# Patient Record
Sex: Male | Born: 1980 | Race: White | Hispanic: No | Marital: Single | Smoking: Current every day smoker
Health system: Southern US, Community
[De-identification: ages and names within clinical notes are randomized; demographics above are authoritative.]

## PROBLEM LIST (undated history)

## (undated) HISTORY — PX: NASAL SINUS SURGERY: SHX719

---

## 2009-11-22 ENCOUNTER — Emergency Department (HOSPITAL_COMMUNITY): Admission: EM | Admit: 2009-11-22 | Discharge: 2009-11-22 | Payer: Self-pay | Admitting: Family Medicine

## 2011-11-06 ENCOUNTER — Emergency Department (INDEPENDENT_AMBULATORY_CARE_PROVIDER_SITE_OTHER)
Admission: EM | Admit: 2011-11-06 | Discharge: 2011-11-06 | Disposition: A | Discharge status: Home / Self Care | Payer: No Typology Code available for payment source | Source: Home / Self Care | Attending: Family Medicine | Admitting: Family Medicine

## 2011-11-06 ENCOUNTER — Emergency Department (INDEPENDENT_AMBULATORY_CARE_PROVIDER_SITE_OTHER): Payer: No Typology Code available for payment source

## 2011-11-06 ENCOUNTER — Encounter (HOSPITAL_COMMUNITY): Payer: Self-pay | Admitting: *Deleted

## 2011-11-06 DIAGNOSIS — M7989 Other specified soft tissue disorders: Secondary | ICD-10-CM

## 2011-11-06 LAB — CBC WITH DIFFERENTIAL/PLATELET
Basophils Absolute: 0 10*3/uL (ref 0.0–0.1)
Eosinophils Absolute: 0 10*3/uL (ref 0.0–0.7)
Eosinophils Relative: 0 % (ref 0–5)
HCT: 39.3 % (ref 39.0–52.0)
Lymphocytes Relative: 7 % — ABNORMAL LOW (ref 12–46)
MCH: 29.7 pg (ref 26.0–34.0)
MCHC: 34.1 g/dL (ref 30.0–36.0)
MCV: 87.1 fL (ref 78.0–100.0)
Monocytes Absolute: 1 10*3/uL (ref 0.1–1.0)
Platelets: 155 10*3/uL (ref 150–400)
RDW: 12.2 % (ref 11.5–15.5)
WBC: 12.5 10*3/uL — ABNORMAL HIGH (ref 4.0–10.5)

## 2011-11-06 MED ORDER — CEPHALEXIN 500 MG PO CAPS: 500.0000 mg | ORAL_CAPSULE | Freq: Two times a day (BID) | ORAL | Status: AC

## 2011-11-06 MED ORDER — TRAMADOL HCL 50 MG PO TABS: 50.0000 mg | ORAL_TABLET | Freq: Four times a day (QID) | ORAL | Status: AC | PRN

## 2011-11-06 MED ORDER — NAPROXEN 500 MG PO TABS: 500.0000 mg | ORAL_TABLET | Freq: Two times a day (BID) | ORAL | Status: AC

## 2011-11-06 NOTE — ED Provider Notes (Signed)
History     CSN: [id number]  Arrival date & time 11/06/11  1645   First MD Initiated Contact with Patient 11/06/11 1708      Chief Complaint  Patient presents with  . Foot Swelling    (Consider location/radiation/quality/duration/timing/severity/associated sxs/prior treatment) HPI Comments: 31 year old male smoker otherwise no significant past medical history. Here complaining of right foot swelling and tenderness first noticed in a.m. today. Reports swelling is getting worse to the point of feeling his shoe to tight and had to remove it later today due to discomfort. Patient reports that he feels some tightness in the dorsum of his right food when he starts walking but this discomfort gets progressively better. Has not taken any medication for pain. Denies any known injury but states that he may have had a furniture heating the dorsum of his foot on Saturday as he moved furniture from one place to another at work. He is unsure about insect bite. He wears sneakers shoes at work. No skin breaks or bruising. Denies discomfort or swelling in right thigh or right lower leg. No personal or family history of blood clots, denies prolonged sitting or standing before his symptoms started. No recent car or airplane trips. Has no taken medications for his symptoms.   History reviewed. No pertinent past medical history.  Past Surgical History  Procedure Date  . Nasal sinus surgery     No family history on file.  History  Substance Use Topics  . Smoking status: Current Everyday Smoker -- 1.0 packs/day  . Smokeless tobacco: Not on file  . Alcohol Use: 18.0 oz/week    30 Cans of beer per week      Review of Systems  Allergies  Review of patient's allergies indicates no known allergies.  Home Medications   Current Outpatient Rx  Name Route Sig Dispense Refill  . CEPHALEXIN 500 MG PO CAPS Oral Take 1 capsule (500 mg total) by mouth 2 (two) times daily. 14 capsule 0  . NAPROXEN 500 MG PO  TABS Oral Take 1 tablet (500 mg total) by mouth 2 (two) times daily with a meal. 20 tablet 0  . TRAMADOL HCL 50 MG PO TABS Oral Take 1 tablet (50 mg total) by mouth every 6 (six) hours as needed for pain. 20 tablet 0    BP 135/80  Pulse 90  Temp 99.5 F (37.5 C) (Oral)  Resp 16  SpO2 100%  Physical Exam  Nursing note and vitals reviewed. Constitutional: He is oriented to person, place, and time. He appears well-developed and well-nourished. No distress.  HENT:  Head: Normocephalic and atraumatic.  Neck: Neck supple.  Cardiovascular: Normal rate, regular rhythm, normal heart sounds and intact distal pulses.  Exam reveals no gallop and no friction rub.   No murmur heard. Pulmonary/Chest: Effort normal and breath sounds normal. No respiratory distress. He has no wheezes. He has no rales. He exhibits no tenderness.  Musculoskeletal:       Right foot: dorsal moderate swelling mostly proximal  in tarsal/metatarsal area with associated erythema and tenderness to palpation. No focal tenderness or fluctuations, skin pustules, abrasions or lacerations. No involvement of MPJ or toes. No ankle bimalleolar swelling or pain with palpation. No bruising, hematomas, echymosis or distal cyanosis. Has intact dorsal pedal and tibial posterior pulses. Intact superficial sensation. No calf or lower leg swelling, erythema or tenderness.  Patient is weight bearing and ambulates with no limping. ( see picture below)  Lymphadenopathy:    He has no  cervical adenopathy.       Right: No inguinal adenopathy present.  Neurological: He is alert and oriented to person, place, and time.  Skin: He is not diaphoretic.       As per MS exam above.         ED Course  Procedures (including critical care time)  Labs Reviewed  CBC WITH DIFFERENTIAL - Abnormal; Notable for the following:    WBC 12.5 (*)     Neutrophils Relative 84 (*)     Neutro Abs 10.6 (*)     Lymphocytes Relative 7 (*)     All other  components within normal limits  URIC ACID   Dg Foot Complete Right  11/06/2011  *RADIOLOGY REPORT*  Clinical Data: Redness and swelling over the metatarsals.  No known injury.  RIGHT FOOT COMPLETE - 3+ VIEW  Comparison: None.  Findings: Soft tissue swelling is seen over the dorsum of the foot. No underlying fracture or dislocation.  No radiopaque foreign body or bony destructive change.  IMPRESSION: Soft tissue swelling.  Otherwise negative.   Original Report Authenticated By: Bernadene Bell. D'ALESSIO, M.D.      1. Foot swelling       MDM  Dorsal foot swelling unknown origin like trauma vs insect bite. Although no skin pustules, brakes or bruising. Erythema concerning for cellulitis or developing underlying skin infections no focalization or pain or fluctuation suggestive of an abscess. Decided to treat with cephalexin, naprosyn, tramadol and supportive care. CBC and uric acid were pending at time of discharge. Asked to return in 48 h or earlier if worsening or new symptoms despite following treatment.         Sharin Grave, MD 11/08/11 1026

## 2011-11-06 NOTE — ED Notes (Signed)
States woke up this morning with swelling and pain to dorsum of right foot.  Does not recall any specific injuries.  Redness and significant swelling noted to area.  States his foot feels tight with first couple of steps when ambulating, but then it starts to improve the more he walks.  Unaware of any fevers.  Has not taken any meds.

## 2011-11-06 NOTE — Discharge Instructions (Signed)
My impression is that the swelling in your foot is likely related to an unknown trauma. There no signs of bone involvement in your x-rays.  There is a risk of infection this is why I decided to prescribe an antibiotic. Keep right lower extremity elevated and use ice pad in the affected area as much as possible. Take the prescribed medications as instructed. Be aware that tramadol can make you drowsy and he should not drive after taking this medication. We will contact you if any of your pending tests is abnormal. Preterm for followup in 48 hours if persistent symptoms or return earlier if worsening symptoms despite following treatment.

## 2011-11-06 NOTE — ED Notes (Signed)
Patient refusing post-op shoe; states, "I know I won't wear it".

## 2013-03-20 ENCOUNTER — Emergency Department (HOSPITAL_COMMUNITY)
Admission: EM | Admit: 2013-03-20 | Discharge: 2013-03-20 | Disposition: A | Discharge status: Home / Self Care | Payer: No Typology Code available for payment source | Source: Home / Self Care

## 2013-03-20 ENCOUNTER — Encounter (HOSPITAL_COMMUNITY): Payer: Self-pay | Admitting: Emergency Medicine

## 2013-03-20 DIAGNOSIS — S61409A Unspecified open wound of unspecified hand, initial encounter: Secondary | ICD-10-CM

## 2013-03-20 DIAGNOSIS — IMO0001 Reserved for inherently not codable concepts without codable children: Secondary | ICD-10-CM

## 2013-03-20 DIAGNOSIS — T148XXA Other injury of unspecified body region, initial encounter: Secondary | ICD-10-CM

## 2013-03-20 DIAGNOSIS — S61451A Open bite of right hand, initial encounter: Secondary | ICD-10-CM

## 2013-03-20 DIAGNOSIS — W5501XA Bitten by cat, initial encounter: Principal | ICD-10-CM

## 2013-03-20 DIAGNOSIS — L089 Local infection of the skin and subcutaneous tissue, unspecified: Secondary | ICD-10-CM

## 2013-03-20 DIAGNOSIS — T798XXA Other early complications of trauma, initial encounter: Secondary | ICD-10-CM

## 2013-03-20 MED ORDER — CEFTRIAXONE SODIUM 1 G IJ SOLR
1.0000 g | Freq: Once | INTRAMUSCULAR | Status: AC
  Administered 2013-03-20: 1 g via INTRAMUSCULAR

## 2013-03-20 MED ORDER — LIDOCAINE HCL (PF) 1 % IJ SOLN
INTRAMUSCULAR | Status: AC
  Filled 2013-03-20: qty 5

## 2013-03-20 MED ORDER — AMOXICILLIN-POT CLAVULANATE 875-125 MG PO TABS: 1.0000 | ORAL_TABLET | Freq: Two times a day (BID) | ORAL | Status: DC

## 2013-03-20 MED ORDER — HYDROCODONE-ACETAMINOPHEN 5-325 MG PO TABS: 1.0000 | ORAL_TABLET | Freq: Four times a day (QID) | ORAL | Status: DC | PRN

## 2013-03-20 MED ORDER — CEFTRIAXONE SODIUM 1 G IJ SOLR
INTRAMUSCULAR | Status: AC
  Filled 2013-03-20: qty 10

## 2013-03-20 NOTE — ED Notes (Signed)
Pt reports that he was bitten by his cat yest on right hand Sxs include: swelling, redness, tenderness... Pain increases w/activity/movement Cat is 1 month behind on his rabies vaccination but has had rabies vaccination in the past He is alert w/no signs of acute distress.

## 2013-03-20 NOTE — ED Provider Notes (Signed)
CSN: [id number]     Arrival date & time 03/20/13  1804 History   None    Chief Complaint  Patient presents with  . Animal Bite   (Consider location/radiation/quality/duration/timing/severity/associated sxs/prior Treatment) HPI Comments: 33 yo male with recent cat scratch on dominant right hand has started to swell and has become red and painful. He notes it is difficult to make fist due to swelling and pain. He denies numbness and tingling. He has not tried any OTC or home remedies. He notes cat has not been acting inappropriately and has started rabies vaccinations.   No past medical history on file. Past Surgical History  Procedure Laterality Date  . Nasal sinus surgery     No family history on file. History  Substance Use Topics  . Smoking status: Current Every Day Smoker -- 1.00 packs/day  . Smokeless tobacco: Not on file  . Alcohol Use: 18.0 oz/week    30 Cans of beer per week    Review of Systems  Musculoskeletal: Positive for joint swelling and myalgias.  Skin: Positive for color change and wound.  All other systems reviewed and are negative.    Allergies  Review of patient's allergies indicates no known allergies.  Home Medications  No current outpatient prescriptions on file. There were no vitals taken for this visit. Physical Exam  Constitutional: He is oriented to person, place, and time. He appears well-developed and well-nourished.  Cardiovascular: Normal rate, regular rhythm and normal heart sounds.   Musculoskeletal: He exhibits edema and tenderness.       Arms: Decreased ROM right hand due to swelling and erythema in area marked   Neurological: He is alert and oriented to person, place, and time. No cranial nerve deficit. Coordination normal.  Skin: Skin is warm and dry. There is erythema.  Psychiatric: Judgment normal.    ED Course  Procedures (including critical care time) Labs Review Labs Reviewed - No data to display Imaging Review No results  found.    MDM  Cat scratch Right hand with Cellulitis- If SX increase ER. Advised if no change needs f/u Dr Merrilee SeashoreKuzma's office. R.I.C.E Rocephin 1 gm in office, Aug 875 bid AD with food. Spoke with DR. Hatcher in I&D about rabies and he noted since cat had already started vaccinations and was not exhibiting symptoms no need for Rabies series to be initiated.  Berenice PrimasMelissa R Marayah Higdon, PA-C 03/20/13 2214

## 2013-03-20 NOTE — Discharge Instructions (Signed)
If symptoms increase this could happen if they occur ER ASAP Acute Compartment Syndrome Compartment syndrome is a painful condition that occurs when swelling and pressure build up in a body space (compartment) of the arms or legs. Groups of muscles, nerves, and blood vessels in the arms and legs are separated into various compartments. Each compartment is surrounded by tough layers of tissue called fascia. In compartment syndrome, pressure builds up within the layers of fascia and begins to push on the structures within that compartment.  In acute compartment syndrome, the pressure builds up suddenly, often as the result of an injury. This is a surgical emergency. When a muscle in the compartment moves, you may feel severe pain. If pressure continues to increase, it can block the flow of blood in the smallest blood vessels (capillaries). Then, the nerves and muscles in the compartment cannot get enough oxygen and nutrients (substances needed for survival). They will start to die within 4 8 hours. That is why the pressure needs to be relieved immediately. Identifying the condition early and treating it quickly can prevent most problems. CAUSES  Various things can lead to compartment syndrome. Possible causes include:   Injury. Some injuries can cause swelling or bleeding in a compartment. This can lead to compartment syndrome. Injuries that may cause this problem include:  Broken bones, especially the long bones of the arms and legs.  Crushing injuries.  Penetrating injuries, such as a knife wound that punctures the skin and tissue underneath.  Badly bruised muscles.  Poisonous bites, such as a snake bite.  Severe burns.  Blocked blood flow. This could result from:  A cast or bandage that is too tight.  A surgical procedure. Blood flow sometimes has to be stopped for a while during a surgery, usually with a tourniquet.  Lying for too long in a position that restricts blood flow. This can  happen in people who have nerve damage or if a person is unconscious for a long time.  Drugs used to build up muscles (anabolic steroids).  Drugs that keep the blood from forming clots (blood thinners). SIGNS AND SYMPTOMS  The most common symptom of compartment syndrome is pain. The pain may:   Get worse when moving or stretching the affected body part.  Be more severe than it should be for an injury.  Come along with a feeling of tingling or burning.  Become worse when the area is pushed or squeezed.  Be unaffected by pain medicine. Other symptoms include:   A feeling of tightness or fullness in the affected area.   A loss of feeling.  Weakness in the area.  Loss of movement.  Skin becoming pale, tight, and shiny over the painful area.  DIAGNOSIS  Your health care provider may suspect the problem based on how you describe the pain. The diagnosis is made by using a special device that measures the pressure in the affected area. Blood tests, X-rays, or an ultrasound exam may be done to help rule out other problems.  TREATMENT  Compartment syndrome is a surgical emergency. It should be treated very quickly.   First-aid treatment is given first. This may include:  Promptly treating an injury.  Loosening or removing any cast, bandage, or external wrap that may be causing pain.  Raising the painful arm or leg to the same level as the heart.  Giving oxygen.  Giving fluid through an IV access tube that is put into a vein in the hand or arm.  Surgery (fasciotomy) is needed to relieve the pressure and help prevent permanent damage. In this surgery, cuts (incisions) are made through the fascia to relieve the pressure in the compartment. Document Released: 02/15/2009 Document Revised: 10/30/2012 Document Reviewed: 10/01/2012 [facility] Patient Information 2014 [place] [employer], [place].  Animal Bite Animal bite wounds can get infected. It is important to get proper medical treatment. Ask  your doctor if you need a rabies shot. HOME CARE   Follow your doctor's instructions for taking care of your wound.  Only take medicine as told by your doctor.  Take your medicine (antibiotics) as told. Finish them even if you start to feel better.  Keep all doctor visits as told. You may need a tetanus shot if:   You cannot remember when you had your last tetanus shot.  You have never had a tetanus shot.  The injury broke your skin. If you need a tetanus shot and you choose not to have one, you may get tetanus. Sickness from tetanus can be serious. GET HELP RIGHT AWAY IF:   Your wound is warm, red, sore, or puffy (swollen).  You notice yellowish-white fluid (pus) or a bad smell coming from the wound.  You see a red line on the skin coming from the wound.  You have a fever, chills, or you feel sick.  You feel sick to your stomach (nauseous), or you throw up (vomit).  Your pain does not go away, or it gets worse.  You have trouble moving the injured part.  You have questions or concerns. MAKE SURE YOU:   Understand these instructions.  Will watch your condition.  Will get help right away if you are not doing well or get worse. Document Released: 02/27/2005 Document Revised: 05/22/2011 Document Reviewed: 10/19/2010 [facility] Patient Information 2014 [place], [place].

## 2013-03-21 ENCOUNTER — Inpatient Hospital Stay (HOSPITAL_COMMUNITY)
Admission: EM | Admit: 2013-03-21 | Discharge: 2013-03-22 | DRG: 914 | Disposition: A | Discharge status: Home / Self Care | Payer: No Typology Code available for payment source | Attending: Internal Medicine | Admitting: Internal Medicine

## 2013-03-21 ENCOUNTER — Encounter (HOSPITAL_COMMUNITY): Payer: Self-pay | Admitting: Emergency Medicine

## 2013-03-21 ENCOUNTER — Emergency Department (INDEPENDENT_AMBULATORY_CARE_PROVIDER_SITE_OTHER)
Admission: EM | Admit: 2013-03-21 | Discharge: 2013-03-21 | Disposition: A | Discharge status: Home / Self Care | Payer: No Typology Code available for payment source | Source: Home / Self Care | Attending: Family Medicine | Admitting: Family Medicine

## 2013-03-21 DIAGNOSIS — S61209A Unspecified open wound of unspecified finger without damage to nail, initial encounter: Principal | ICD-10-CM | POA: Diagnosis present

## 2013-03-21 DIAGNOSIS — L02519 Cutaneous abscess of unspecified hand: Secondary | ICD-10-CM | POA: Diagnosis present

## 2013-03-21 DIAGNOSIS — S61451A Open bite of right hand, initial encounter: Secondary | ICD-10-CM

## 2013-03-21 DIAGNOSIS — W5501XD Bitten by cat, subsequent encounter: Secondary | ICD-10-CM

## 2013-03-21 DIAGNOSIS — S61451D Open bite of right hand, subsequent encounter: Secondary | ICD-10-CM

## 2013-03-21 DIAGNOSIS — M65849 Other synovitis and tenosynovitis, unspecified hand: Secondary | ICD-10-CM

## 2013-03-21 DIAGNOSIS — L03119 Cellulitis of unspecified part of limb: Secondary | ICD-10-CM

## 2013-03-21 DIAGNOSIS — IMO0001 Reserved for inherently not codable concepts without codable children: Secondary | ICD-10-CM

## 2013-03-21 DIAGNOSIS — L089 Local infection of the skin and subcutaneous tissue, unspecified: Secondary | ICD-10-CM

## 2013-03-21 DIAGNOSIS — W5501XA Bitten by cat, initial encounter: Secondary | ICD-10-CM

## 2013-03-21 DIAGNOSIS — S61259A Open bite of unspecified finger without damage to nail, initial encounter: Secondary | ICD-10-CM

## 2013-03-21 DIAGNOSIS — Y92009 Unspecified place in unspecified non-institutional (private) residence as the place of occurrence of the external cause: Secondary | ICD-10-CM

## 2013-03-21 DIAGNOSIS — S61409A Unspecified open wound of unspecified hand, initial encounter: Secondary | ICD-10-CM | POA: Diagnosis present

## 2013-03-21 DIAGNOSIS — F17200 Nicotine dependence, unspecified, uncomplicated: Secondary | ICD-10-CM | POA: Diagnosis present

## 2013-03-21 DIAGNOSIS — M65839 Other synovitis and tenosynovitis, unspecified forearm: Secondary | ICD-10-CM

## 2013-03-21 DIAGNOSIS — M659 Synovitis and tenosynovitis, unspecified: Secondary | ICD-10-CM

## 2013-03-21 LAB — CBC WITH DIFFERENTIAL/PLATELET
Basophils Absolute: 0 10*3/uL (ref 0.0–0.1)
Basophils Relative: 0 % (ref 0–1)
EOS PCT: 1 % (ref 0–5)
Eosinophils Absolute: 0.1 10*3/uL (ref 0.0–0.7)
HEMATOCRIT: 41.7 % (ref 39.0–52.0)
Hemoglobin: 14.4 g/dL (ref 13.0–17.0)
LYMPHS ABS: 1.8 10*3/uL (ref 0.7–4.0)
LYMPHS PCT: 15 % (ref 12–46)
MCH: 30.8 pg (ref 26.0–34.0)
MCHC: 34.5 g/dL (ref 30.0–36.0)
MCV: 89.1 fL (ref 78.0–100.0)
Monocytes Absolute: 1.2 10*3/uL — ABNORMAL HIGH (ref 0.1–1.0)
Monocytes Relative: 10 % (ref 3–12)
NEUTROS ABS: 8.6 10*3/uL — AB (ref 1.7–7.7)
Neutrophils Relative %: 74 % (ref 43–77)
PLATELETS: 143 10*3/uL — AB (ref 150–400)
RBC: 4.68 MIL/uL (ref 4.22–5.81)
RDW: 12.4 % (ref 11.5–15.5)
WBC: 11.6 10*3/uL — AB (ref 4.0–10.5)

## 2013-03-21 LAB — BASIC METABOLIC PANEL
BUN: 8 mg/dL (ref 6–23)
CALCIUM: 9.4 mg/dL (ref 8.4–10.5)
CHLORIDE: 99 meq/L (ref 96–112)
CO2: 26 meq/L (ref 19–32)
Creatinine, Ser: 0.7 mg/dL (ref 0.50–1.35)
GFR calc Af Amer: >90 mL/min (ref >90)
GFR calc non Af Amer: >90 mL/min (ref >90)
GLUCOSE: 99 mg/dL (ref 70–99)
Potassium: 4 mEq/L (ref 3.7–5.3)
SODIUM: 138 meq/L (ref 137–147)

## 2013-03-21 MED ORDER — OXYCODONE-ACETAMINOPHEN 5-325 MG PO TABS
2.0000 | ORAL_TABLET | Freq: Once | ORAL | Status: AC
  Administered 2013-03-21: 2 via ORAL
  Filled 2013-03-21: qty 2

## 2013-03-21 MED ORDER — OXYCODONE-ACETAMINOPHEN 5-325 MG PO TABS: 1.0000 | ORAL_TABLET | Freq: Once | ORAL | Status: DC

## 2013-03-21 NOTE — ED Notes (Signed)
Chart consult

## 2013-03-21 NOTE — ED Notes (Signed)
Pt states that Vicodin prescribed by [facility] did not help with pain but the percocet he received here helped very much. Also states since he has been here redness has decreased.

## 2013-03-21 NOTE — ED Notes (Signed)
Presents with cat bite to right hand occurred 2 days ago [facility] and given antibiotic, Augmentin and hydrocodone for pain. Today redness warmth and swelling has increased and spread above wrist. Pain is severe, unable to sleep, pain medication not helping. CMS intact, +2 radial pulse.

## 2013-03-21 NOTE — ED Provider Notes (Signed)
Patrick Mahoney is a 33 y.o. male who presents to Urgent Care today for right hand infection. Patient was bitten on the right hand 2 days ago. He was seen in the emergency room yesterday when his hand became painful red and swollen. He was treated empirically with gram of IM ceftriaxone, and oral Augmentin. This has not helped much. His pain and swelling have worsened. He notes severe pain in pain with hand motion. He denies any pain radiating beyond his wrist weakness or numbness. He is right-hand dominant. Of note patient drinks about a sixpack a day. He has successfully quit drinking in the recent past without any withdrawal symptoms.  History reviewed. No pertinent past medical history. History  Substance Use Topics  . Smoking status: Current Every Day Smoker -- 1.00 packs/day  . Smokeless tobacco: Not on file  . Alcohol Use: 18.0 oz/week    30 Cans of beer per week   ROS as above Medications reviewed. No current facility-administered medications for this encounter.   Current Outpatient Prescriptions  Medication Sig Dispense Refill  . amoxicillin-clavulanate (AUGMENTIN) 875-125 MG per tablet Take 1 tablet by mouth 2 (two) times daily.  20 tablet  0  . HYDROcodone-acetaminophen (NORCO) 5-325 MG per tablet Take 1 tablet by mouth every 6 (six) hours as needed for moderate pain.  15 tablet  0    Exam:  BP 150/86  Pulse 60  Temp(Src) 98 F (36.7 C) (Oral)  Resp 16  SpO2 100% Gen: Well NAD HEENT:  MMM Lungs: Normal work of breathing. CTABL Exts:warm and well perfused.  Neuro: Slight tremor bilaterally Right hand: Erythema and induration especially in the interdigital webspace between the first and second digits. Erythema extends to the 4th dorsal compartment. Additionally erythema extends just past the proximal carpal row and to the MCPs.  Pain with and flexion motion and wrist motion. Capillary refill and sensation are intact distally.  Assessment and Plan: 33 y.o. male with  worsening infected tenosynovitis secondary to cat bite despite IM ceftriaxone and Augmentin. This is concerning for tenosynovitis and not just cellulitis. Patient will likely require IV antibiotics, and consultation with hand surgery with possible drainage of abscess tonight. Plan to transfer to the emergency room via shuttle for this to be done.  Discussed warning signs or symptoms. Please see discharge instructions. Patient expresses understanding.    Rodolph BongEvan S Kein Carlberg, MD 03/21/13 [phone]

## 2013-03-21 NOTE — ED Notes (Signed)
Pt here for follow up on right hand infection.  Still having pain 9/10.  Mild swelling and redness.  Taking meds as prescribed.

## 2013-03-22 DIAGNOSIS — W5501XA Bitten by cat, initial encounter: Secondary | ICD-10-CM

## 2013-03-22 DIAGNOSIS — L089 Local infection of the skin and subcutaneous tissue, unspecified: Secondary | ICD-10-CM | POA: Diagnosis present

## 2013-03-22 DIAGNOSIS — S61259A Open bite of unspecified finger without damage to nail, initial encounter: Secondary | ICD-10-CM

## 2013-03-22 DIAGNOSIS — S61451A Open bite of right hand, initial encounter: Secondary | ICD-10-CM

## 2013-03-22 DIAGNOSIS — IMO0001 Reserved for inherently not codable concepts without codable children: Secondary | ICD-10-CM

## 2013-03-22 DIAGNOSIS — S61409A Unspecified open wound of unspecified hand, initial encounter: Secondary | ICD-10-CM

## 2013-03-22 DIAGNOSIS — Z5189 Encounter for other specified aftercare: Secondary | ICD-10-CM

## 2013-03-22 MED ORDER — SODIUM CHLORIDE 0.9 % IV SOLN: INTRAVENOUS | Status: DC

## 2013-03-22 MED ORDER — HYDROCODONE-ACETAMINOPHEN 5-325 MG PO TABS
1.0000 | ORAL_TABLET | Freq: Four times a day (QID) | ORAL | Status: DC | PRN
Start: 2013-03-22 — End: 2013-03-22

## 2013-03-22 MED ORDER — DOXYCYCLINE HYCLATE 100 MG PO TABS: 100.0000 mg | ORAL_TABLET | Freq: Two times a day (BID) | ORAL | Status: DC

## 2013-03-22 MED ORDER — SODIUM CHLORIDE 0.9 % IV SOLN
3.0000 g | Freq: Four times a day (QID) | INTRAVENOUS | Status: DC
  Administered 2013-03-22 (×3): 3 g via INTRAVENOUS
  Filled 2013-03-22 (×6): qty 3

## 2013-03-22 MED ORDER — SODIUM CHLORIDE 0.9 % IV SOLN
Freq: Once | INTRAVENOUS | Status: AC
  Administered 2013-03-22: 02:00:00 via INTRAVENOUS

## 2013-03-22 NOTE — Progress Notes (Signed)
Patient provided with discharge instructions and follow up information. Educated on the importance of finishing full dose of antibiotics as prescribed. He is going home in private vehicle with friends at this time.

## 2013-03-22 NOTE — H&P (Signed)
[facility] History and Physical  Patrick Mahoney ZOX:[id number] DOB: [birth year] DOA: 03/21/2013  Referring physician: EDP PCP: No PCP Per Patient   Chief Complaint: Cat bite   HPI: Patrick Mahoney is a 33 y.o. male who was bit on R hand 2 days ago by his cat.  Patient owns the cat, cat has started rabies vaccinations, cat not acting unusual or differently.  1 day after the cat bite his hand became inflamed and swollen.  He was seen in the ED and started on ampicillin.  Unfortunately the swelling and erythema spread despite PO treatment and so he re-presented to the ED today.  Review of Systems: Systems reviewed.  As above, otherwise negative  History reviewed. No pertinent past medical history. Past Surgical History  Procedure Laterality Date  . Nasal sinus surgery     Social History:  reports that he has been smoking.  He does not have any smokeless tobacco history on file. He reports that he drinks about 18.0 ounces of alcohol per week. He reports that he does not use illicit drugs.  No Known Allergies  History reviewed. No pertinent family history.   Prior to Admission medications   Medication Sig Start Date End Date Taking? Authorizing Provider  amoxicillin-clavulanate (AUGMENTIN) 875-125 MG per tablet Take 1 tablet by mouth 2 (two) times daily. 03/20/13  Yes Melissa R Smith, PA-C  HYDROcodone-acetaminophen (NORCO) 5-325 MG per tablet Take 1 tablet by mouth every 6 (six) hours as needed for moderate pain. 03/20/13  Yes Berenice Primas, PA-C   Physical Exam: Filed Vitals:   03/22/13 0218  BP: 128/87  Pulse: 54  Temp: 98.7 F (37.1 C)  Resp: 14    BP 128/87  Pulse 54  Temp(Src) 98.7 F (37.1 C) (Oral)  Resp 14  Wt 59.988 kg (132 lb 4 oz)  SpO2 100%  General Appearance:    Alert, oriented, no distress, appears stated age  Head:    Normocephalic, atraumatic  Eyes:    PERRL, EOMI, sclera non-icteric        Nose:   Nares without drainage or epistaxis.  Mucosa, turbinates normal  Throat:   Moist mucous membranes. Oropharynx without erythema or exudate.  Neck:   Supple. No carotid bruits.  No thyromegaly.  No lymphadenopathy.   Back:     No CVA tenderness, no spinal tenderness  Lungs:     Clear to auscultation bilaterally, without wheezes, rhonchi or rales  Chest wall:    No tenderness to palpitation  Heart:    Regular rate and rhythm without murmurs, gallops, rubs  Abdomen:     Soft, non-tender, nondistended, normal bowel sounds, no organomegaly  Genitalia:    deferred  Rectal:    deferred  Extremities:   No clubbing, cyanosis or edema.  Pulses:   2+ and symmetric all extremities  Skin:   Dorsum of R hand is erythematous, edematous, inflamed  Lymph nodes:   Cervical, supraclavicular, and axillary nodes normal  Neurologic:   CNII-XII intact. Normal strength, sensation and reflexes      throughout    Labs on Admission:  Basic Metabolic Panel:  Recent Labs Lab 03/21/13 1831  NA 138  K 4.0  CL 99  CO2 26  GLUCOSE 99  BUN 8  CREATININE 0.70  CALCIUM 9.4   Liver Function Tests: No results found for this basename: AST, ALT, ALKPHOS, BILITOT, PROT, ALBUMIN,  in the last 168 hours No results found for this basename: LIPASE, AMYLASE,  in the last 168 hours No results found for this basename: AMMONIA,  in the last 168 hours CBC:  Recent Labs Lab 03/21/13 1831  WBC 11.6*  NEUTROABS 8.6*  HGB 14.4  HCT 41.7  MCV 89.1  PLT 143*   Cardiac Enzymes: No results found for this basename: CKTOTAL, CKMB, CKMBINDEX, TROPONINI,  in the last 168 hours  BNP (last 3 results) No results found for this basename: PROBNP,  in the last 8760 hours CBG: No results found for this basename: GLUCAP,  in the last 168 hours  Radiological Exams on Admission: No results found.  EKG: Independently reviewed.  Assessment/Plan Active Problems:   Cat bite of right hand including fingers with infection   1. Cat bite of R hand with resulting  cellulitis - failed outpatient therpay patient now being admitted on unasyn, NPO for possible surgery later today, Dr. Reita ClicheWeinhold will consult on patient later today for possible OR intervention.   Code Status: Full Code  Family Communication: No family in room Disposition Plan: Admit to inpatient   Time spent: 30 min  GARDNER, JARED M. [facility] Pager [phone]  If 7AM-7PM, please contact the day team taking care of the patient [email or web address] Password [email or web address] 03/22/2013, 6:02 AM

## 2013-03-22 NOTE — ED Provider Notes (Signed)
CSN: [id number]     Arrival date & time 03/21/13  1815 History   First MD Initiated Contact with Patient 03/22/13 0109     Chief Complaint  Patient presents with  . Cellulitis   (Consider location/radiation/quality/duration/timing/severity/associated sxs/prior Treatment) HPI  History reviewed. No pertinent past medical history. Past Surgical History  Procedure Laterality Date  . Nasal sinus surgery     History reviewed. No pertinent family history. History  Substance Use Topics  . Smoking status: Current Every Day Smoker -- 1.00 packs/day  . Smokeless tobacco: Not on file  . Alcohol Use: 18.0 oz/week    30 Cans of beer per week    Review of Systems  Allergies  Review of patient's allergies indicates no known allergies.  Home Medications   Current Outpatient Rx  Name  Route  Sig  Dispense  Refill  . amoxicillin-clavulanate (AUGMENTIN) 875-125 MG per tablet   Oral   Take 1 tablet by mouth 2 (two) times daily.   20 tablet   0   . HYDROcodone-acetaminophen (NORCO) 5-325 MG per tablet   Oral   Take 1 tablet by mouth every 6 (six) hours as needed for moderate pain.   15 tablet   0    BP 128/87  Pulse 54  Temp(Src) 98.7 F (37.1 C) (Oral)  Resp 14  Wt 132 lb 4 oz (59.988 kg)  SpO2 100% Physical Exam  ED Course  Procedures (including critical care time) Labs Review Labs Reviewed  CBC WITH DIFFERENTIAL - Abnormal; Notable for the following:    WBC 11.6 (*)    Platelets 143 (*)    Neutro Abs 8.6 (*)    Monocytes Absolute 1.2 (*)    All other components within normal limits  BASIC METABOLIC PANEL   Imaging Review No results found.  EKG Interpretation   None       MDM   1. Cat bite of hand, right, subsequent encounter        Arman FilterGail K Shawndell Varas, NP 03/22/13 [phone]

## 2013-03-22 NOTE — ED Provider Notes (Signed)
33 year old male had presented yesterday following a cat bite to his right hand. He was sent home on Augmentin but redness and swelling has progressed in spite of being on antibiotic. On exam, there is erythema and soft tissue swelling over the dorsum of the right hand with a swelling extending into the proximal portion of his fingers. At this point, he will need to be admitted for IV antibiotics.  Medical screening examination/treatment/procedure(s) were conducted as a shared visit with non-physician practitioner(s) and myself.  I personally evaluated the patient during the encounter.   Dione Boozeavid Mont Jagoda, MD 03/22/13 [phone]

## 2013-03-22 NOTE — Consult Note (Signed)
Reason for Consult:cat bite right hand Referring Physician: hospitalist service  Patrick Mahoney is an 33 y.o. male.  HPI: s/p cat bite to right hand several days ago with worsening pain and swelling despite PO ABX  History reviewed. No pertinent past medical history.  Past Surgical History  Procedure Laterality Date  . Nasal sinus surgery      History reviewed. No pertinent family history.  Social History:  reports that he has been smoking.  He does not have any smokeless tobacco history on file. He reports that he drinks about 18.0 ounces of alcohol per week. He reports that he does not use illicit drugs.  Allergies: No Known Allergies  Medications:  Scheduled: . ampicillin-sulbactam (UNASYN) IV  3 g Intravenous Q6H    Results for orders placed during the hospital encounter of 03/21/13 (from the past 48 hour(s))  CBC WITH DIFFERENTIAL     Status: Abnormal   Collection Time    03/21/13  6:31 PM      Result Value Range   WBC 11.6 (*) 4.0 - 10.5 K/uL   RBC 4.68  4.22 - 5.81 MIL/uL   Hemoglobin 14.4  13.0 - 17.0 g/dL   HCT 41.7  39.0 - 52.0 %   MCV 89.1  78.0 - 100.0 fL   MCH 30.8  26.0 - 34.0 pg   MCHC 34.5  30.0 - 36.0 g/dL   RDW 12.4  11.5 - 15.5 %   Platelets 143 (*) 150 - 400 K/uL   Neutrophils Relative % 74  43 - 77 %   Neutro Abs 8.6 (*) 1.7 - 7.7 K/uL   Lymphocytes Relative 15  12 - 46 %   Lymphs Abs 1.8  0.7 - 4.0 K/uL   Monocytes Relative 10  3 - 12 %   Monocytes Absolute 1.2 (*) 0.1 - 1.0 K/uL   Eosinophils Relative 1  0 - 5 %   Eosinophils Absolute 0.1  0.0 - 0.7 K/uL   Basophils Relative 0  0 - 1 %   Basophils Absolute 0.0  0.0 - 0.1 K/uL  BASIC METABOLIC PANEL     Status: None   Collection Time    03/21/13  6:31 PM      Result Value Range   Sodium 138  137 - 147 mEq/L   Potassium 4.0  3.7 - 5.3 mEq/L   Chloride 99  96 - 112 mEq/L   CO2 26  19 - 32 mEq/L   Glucose, Bld 99  70 - 99 mg/dL   BUN 8  6 - 23 mg/dL   Creatinine, Ser 0.70  0.50 - 1.35  mg/dL   Calcium 9.4  8.4 - 10.5 mg/dL   GFR calc non Af Amer >90  >90 mL/min   GFR calc Af Amer >90  >90 mL/min   Comment: (NOTE)     The eGFR has been calculated using the CKD EPI equation.     This calculation has not been validated in all clinical situations.     eGFR's persistently <90 mL/min signify possible Chronic Kidney     Disease.    No results found.  Review of Systems  Musculoskeletal: Positive for joint pain.   Blood pressure 128/80, pulse 70, temperature 97.7 F (36.5 C), temperature source Oral, resp. rate 16, weight 59.988 kg (132 lb 4 oz), SpO2 100.00%. Physical Exam  Constitutional: He is oriented to person, place, and time. He appears well-developed and well-nourished.  HENT:  Head: Normocephalic and atraumatic.  Cardiovascular: Normal rate.   Respiratory: Effort normal.  Musculoskeletal:  Mild right hand dorsal swelling with no signs of tendon sheath or joint infection  Neurological: He is alert and oriented to person, place, and time.  Skin: Skin is warm.  Psychiatric: He has a normal mood and affect. His behavior is normal. Judgment and thought content normal.    Assessment/Plan: As above  No surgical intervention needed  Start warm soapy soaks TID and redraw WBC  Would d/c when WBC normalizes  Toniqua Melamed A 03/22/2013, 11:08 AM

## 2013-03-22 NOTE — Discharge Summary (Signed)
Physician Discharge Summary  Patrick Mahoney OMV:[id number] DOB: 1980-10-10 DOA: 03/21/2013  PCP: No PCP Per Patient  Admit date: 03/21/2013 Discharge date: 03/22/2013  Time spent: 45 minutes  Recommendations for Outpatient Follow-up:  -Will be discharged home today. -Advised to find a medical provider for follow up in 2 weeks (he has medical insurance).   Discharge Diagnoses:  Active Problems:   Cat bite of right hand including fingers with infection   Discharge Condition: Stable and improved  Filed Weights   03/21/13 1819  Weight: 59.988 kg (132 lb 4 oz)    History of present illness:  Patrick Mahoney is a 33 y.o. male who was bit on R hand 2 days ago by his cat. Patient owns the cat, cat has started rabies vaccinations, cat not acting unusual or differently. 1 day after the cat bite his hand became inflamed and swollen. He was seen in the ED and started on ampicillin. Unfortunately the swelling and erythema spread despite PO treatment and so he re-presented to the ED today. Hospitalist admission was requested.   Hospital Course:   Right Hand Infection from Cat Bite -Had significant improvement with Unasyn overnight. -Was seen in consultation by Dr. Mina Marble who did not believe he required surgical intervention as there was no sign of tendon sheath or joint infection. -Patient was advised to stay overnight to receive IV antibiotics and await for WBC count to normalize, however he elected to DC home today. -Was given a prescription for a 10 days course of doxycycline as he had failed a course of Augmentin in the OP setting before.  Procedures:  None   Consultations:  Hand Surgery, Dr. Mina Marble  Discharge Instructions  Discharge Orders   Future Orders Complete By Expires   Discontinue IV  As directed        Medication List    STOP taking these medications       amoxicillin-clavulanate 875-125 MG per tablet  Commonly known as:  AUGMENTIN     HYDROcodone-acetaminophen 5-325 MG per tablet  Commonly known as:  NORCO      TAKE these medications       doxycycline 100 MG tablet  Commonly known as:  VIBRA-TABS  Take 1 tablet (100 mg total) by mouth 2 (two) times daily. For 10 days       No Known Allergies     Follow-up Information   Schedule an appointment as soon as possible for a visit in 2 weeks to follow up. (with your new medical provider)        The results of significant diagnostics from this hospitalization (including imaging, microbiology, ancillary and laboratory) are listed below for reference.    Significant Diagnostic Studies: No results found.  Microbiology: No results found for this or any previous visit (from the past 240 hour(s)).   Labs: Basic Metabolic Panel:  Recent Labs Lab 03/21/13 1831  NA 138  K 4.0  CL 99  CO2 26  GLUCOSE 99  BUN 8  CREATININE 0.70  CALCIUM 9.4   Liver Function Tests: No results found for this basename: AST, ALT, ALKPHOS, BILITOT, PROT, ALBUMIN,  in the last 168 hours No results found for this basename: LIPASE, AMYLASE,  in the last 168 hours No results found for this basename: AMMONIA,  in the last 168 hours CBC:  Recent Labs Lab 03/21/13 1831  WBC 11.6*  NEUTROABS 8.6*  HGB 14.4  HCT 41.7  MCV 89.1  PLT 143*   Cardiac Enzymes: No  results found for this basename: CKTOTAL, CKMB, CKMBINDEX, TROPONINI,  in the last 168 hours BNP: BNP (last 3 results) No results found for this basename: PROBNP,  in the last 8760 hours CBG: No results found for this basename: GLUCAP,  in the last 168 hours     Signed:  Chaya JanHERNANDEZ ACOSTA,ESTELA  [facility] Pager: [phone] 03/22/2013, 4:42 PM

## 2013-03-22 NOTE — Progress Notes (Signed)
Pt arrived to 5N05 via stretcher from ED. States pain is "zero" at this time. R hand elevated. Pt understanding of NPO and plan of care for possible surgery later today.

## 2013-03-22 NOTE — ED Provider Notes (Signed)
CSN: [id number]     Arrival date & time 03/21/13  1815 History   First MD Initiated Contact with Patient 03/22/13 0109     Chief Complaint  Patient presents with  . Cellulitis   (Consider location/radiation/quality/duration/timing/severity/associated sxs/prior Treatment) HPI Comments: Patient was bitten on the dorsum of his right hand by his own cat 2 days ago.  He was seen at urgent care yesterday.  Started on Augmentin and Vicodin.  He is still had significant swelling, pain, with red streaking going up past the wrist joint  The history is provided by the patient.    History reviewed. No pertinent past medical history. Past Surgical History  Procedure Laterality Date  . Nasal sinus surgery     History reviewed. No pertinent family history. History  Substance Use Topics  . Smoking status: Current Every Day Smoker -- 1.00 packs/day  . Smokeless tobacco: Not on file  . Alcohol Use: 18.0 oz/week    30 Cans of beer per week    Review of Systems  Constitutional: Negative for fever and chills.  Musculoskeletal: Positive for joint swelling.  Skin: Positive for color change and wound.  Neurological: Negative for dizziness and headaches.  All other systems reviewed and are negative.    Allergies  Review of patient's allergies indicates no known allergies.  Home Medications   Current Outpatient Rx  Name  Route  Sig  Dispense  Refill  . amoxicillin-clavulanate (AUGMENTIN) 875-125 MG per tablet   Oral   Take 1 tablet by mouth 2 (two) times daily.   20 tablet   0   . HYDROcodone-acetaminophen (NORCO) 5-325 MG per tablet   Oral   Take 1 tablet by mouth every 6 (six) hours as needed for moderate pain.   15 tablet   0    BP 128/87  Pulse 54  Temp(Src) 98.7 F (37.1 C) (Oral)  Resp 14  Wt 132 lb 4 oz (59.988 kg)  SpO2 100% Physical Exam  Nursing note and vitals reviewed. Constitutional: He appears well-developed and well-nourished.  HENT:  Head: Normocephalic.   Eyes: Pupils are equal, round, and reactive to light.  Neck: Normal range of motion.  Cardiovascular: Normal rate.   Pulmonary/Chest: Effort normal.  Musculoskeletal: He exhibits edema and tenderness.  Neurological: He is alert.  Skin: Skin is warm. There is erythema.    ED Course  Procedures (including critical care time) Labs Review Labs Reviewed  CBC WITH DIFFERENTIAL - Abnormal; Notable for the following:    WBC 11.6 (*)    Platelets 143 (*)    Neutro Abs 8.6 (*)    Monocytes Absolute 1.2 (*)    All other components within normal limits  BASIC METABOLIC PANEL   Imaging Review No results found.  EKG Interpretation   None       MDM   1. Cat bite of hand, right, subsequent encounter       Patient has been given IV Unasyn.  The swelling, and redness appeared to stopped at the line of demarcation drawn on his arrival.  I spoke with Dr. Reita ClicheWeinhold will be glad to consult on this patient.  He requests that he be kept n.p.o. until he has been seen later today by him self in case he needs to go to the OR for intervention  Arman FilterGail K Telford Archambeau, NP 03/22/13 [phone]

## 2013-03-24 NOTE — ED Provider Notes (Signed)
Medical screening examination/treatment/procedure(s) were performed by resident physician or non-physician practitioner and as supervising physician I was immediately available for consultation/collaboration.   Kyelle Urbas DOUGLAS MD.   Egan Sahlin D Jillianne Gamino, MD 03/24/13 1422 

## 2014-12-17 ENCOUNTER — Encounter (HOSPITAL_COMMUNITY): Payer: Self-pay | Admitting: *Deleted

## 2014-12-17 ENCOUNTER — Emergency Department (INDEPENDENT_AMBULATORY_CARE_PROVIDER_SITE_OTHER)
Admission: EM | Admit: 2014-12-17 | Discharge: 2014-12-17 | Disposition: A | Discharge status: Home / Self Care | Payer: No Typology Code available for payment source | Source: Home / Self Care | Attending: Family Medicine | Admitting: Family Medicine

## 2014-12-17 DIAGNOSIS — R599 Enlarged lymph nodes, unspecified: Secondary | ICD-10-CM | POA: Diagnosis not present

## 2014-12-17 DIAGNOSIS — R590 Localized enlarged lymph nodes: Secondary | ICD-10-CM

## 2014-12-17 NOTE — ED Notes (Signed)
Pt  Reports     Lumps    On  r  Thigh  X  2    He  States  He  Noticed  Them  A  Few  Days  Ago    He  States  He  Had  A  Fever  Just prior  To  Getting  The  Bumps      he  States  He  Is in  Good  Health         And  Is  Awake  And  Alert and  oriented  He  denys  Any  Known  specefic  Injury      Or  Any  Sores

## 2014-12-17 NOTE — ED Provider Notes (Addendum)
CSN: [id number]     Arrival date & time 12/17/14  1801 History   First MD Initiated Contact with Patient 12/17/14 1858     Chief Complaint  Patient presents with  . Mass   (Consider location/radiation/quality/duration/timing/severity/associated sxs/prior Treatment) Patient is a 34 y.o. male presenting with general illness. The history is provided by the patient.  Illness Location:  Right inguinal lumps for 3d. Severity:  Moderate Onset quality:  Gradual Duration:  3 days Progression:  Worsening Chronicity:  New Context:  2 lumps unknown etiol, fever sev days ago , no other sx. Associated symptoms: fever   Associated symptoms: no rash   Risk factors:  Denies rash, never checked for HIV.   History reviewed. No pertinent past medical history. Past Surgical History  Procedure Laterality Date  . Nasal sinus surgery     No family history on file. Social History  Substance Use Topics  . Smoking status: Current Every Day Smoker -- 2.00 packs/day    Types: Cigarettes  . Smokeless tobacco: None  . Alcohol Use: 18.0 oz/week    30 Cans of beer per week    Review of Systems  Constitutional: Positive for fever.  HENT: Negative.   Gastrointestinal: Negative.   Genitourinary: Negative.   Skin: Negative for rash.  Hematological: Positive for adenopathy.  All other systems reviewed and are negative.   Allergies  Review of patient's allergies indicates no known allergies.  Home Medications   Prior to Admission medications   Not on File   Meds Ordered and Administered this Visit  Medications - No data to display  BP 142/76 mmHg  Pulse 72  Temp(Src) 98.6 F (37 C) (Oral)  Resp 18  SpO2 99% No data found.   Physical Exam  Constitutional: He is oriented to person, place, and time. He appears well-developed and well-nourished. No distress.  HENT:  Mouth/Throat: Oropharynx is clear and moist.  Neck: Normal range of motion. Neck supple.  Pulmonary/Chest: Effort normal  and breath sounds normal.  Abdominal: Soft. Bowel sounds are normal. He exhibits mass. He exhibits no distension. There is no tenderness.  2 firm right inguinal masses c/w adenopathy, no erythema, nonfluctuant, no other rashes or adenopathy palp in axilla, cervical or left inguinal regions.  Lymphadenopathy:    He has no cervical adenopathy.  Neurological: He is alert and oriented to person, place, and time.  Skin: Skin is warm and dry.  Nursing note and vitals reviewed.   ED Course  Procedures (including critical care time)  Labs Review Labs Reviewed - No data to display  Imaging Review No results found.   Visual Acuity Review  Right Eye Distance:   Left Eye Distance:   Bilateral Distance:    Right Eye Near:   Left Eye Near:    Bilateral Near:         MDM   1. Lymphadenopathy, inguinal       Linna Hoff, MD 12/17/14 1930  Linna Hoff, MD 12/18/14 [phone]

## 2014-12-17 NOTE — Discharge Instructions (Signed)
Call dr Denyse Amass for appt for further eval of lymph node swelling in your right groin.

## 2014-12-18 ENCOUNTER — Encounter: Payer: Self-pay | Admitting: Osteopathic Medicine

## 2014-12-18 ENCOUNTER — Ambulatory Visit (INDEPENDENT_AMBULATORY_CARE_PROVIDER_SITE_OTHER): Payer: No Typology Code available for payment source | Admitting: Osteopathic Medicine

## 2014-12-18 VITALS — BP 127/80 | HR 83 | Temp 98.7°F | Ht 65.0 in | Wt 128.0 lb

## 2014-12-18 DIAGNOSIS — R599 Enlarged lymph nodes, unspecified: Secondary | ICD-10-CM

## 2014-12-18 DIAGNOSIS — R590 Localized enlarged lymph nodes: Secondary | ICD-10-CM | POA: Insufficient documentation

## 2014-12-18 NOTE — Patient Instructions (Addendum)
THERE ARE MANY POSSIBITILITIES FOR ENLARGED LYMPH NODES - WE NEED TO ENSURE THAT THERE IS NOTHING CONCERNING - WE WILL CHECK LABS AND URINE FOR POSSIBLE INFECTION/INFLAMMATION. WE WILL CALL IF ANY LABS COME BACK DANGEROUSLY ABNORMAL OVER THE WEEKEND, BUT SOME OF THE LABS TAKE SOME TIME TO RESULT. WE SHOULD HAVE ALL RESULTS BACK BY MONDAY OR TUESDAY AND YOU SHOULD GET A CALL ABOUT THESE.  YOU SHOULD HEAR ABOUT SCHEDULING AN ULTRASOUND TO BETTER IMAGE THESE LYMPH NODES. IF YOU DON'T HEAR BACK ABOUT THAT BY MONDAY LET us KNOW!

## 2014-12-18 NOTE — Progress Notes (Signed)
HPI: Patrick Mahoney is a 34 y.o. male who presents to [facility]  today for chief complaint of:  Chief Complaint  Patient presents with  . Establish Care  . Adenopathy    fever at night for the past two nights; paper rx today    Swollen lymph nodes in groin. . Location: R groin . Quality: sore . Severity: mild . Duration: First lump appeared 2 days ago, second lump appeared yesterday . Context: May have injured leg at work, but cannot recall. Went to urgent care and was told he would be better served establishing care physician. . Assoc signs/symptoms: Subjective fever/chills 1 day.   Past medical, social and family history reviewed: History reviewed. No pertinent past medical history. Past Surgical History  Procedure Laterality Date  . Nasal sinus surgery     Social History  Substance Use Topics  . Smoking status: Current Every Day Smoker -- 2.00 packs/day    Types: Cigarettes  . Smokeless tobacco: Not on file  . Alcohol Use: 18.0 oz/week    30 Cans of beer per week   Family History  Problem Relation Age of Onset  . Diabetes Mother     No current outpatient prescriptions on file.   No current facility-administered medications for this visit.   No Known Allergies    Review of Systems: CONSTITUTIONAL: Positive as per history of present illness fever/chills, no unintentional weight changes HEAD/EYES/EARS/NOSE/THROAT: No headache/vision change or hearing change, no sore throat CARDIAC: No chest pain/pressure/palpitations, no orthopnea RESPIRATORY: No cough/shortness of breath/wheeze GASTROINTESTINAL: No nausea/vomiting/abdominal pain/blood in stool/diarrhea/constipation MUSCULOSKELETAL: No myalgia/arthralgia GENITOURINARY: No incontinence, No abnormal genital bleeding/discharge SKIN: No rash/wounds/concerning lesions HEM/ONC: No easy bruising/bleeding, positive abnormal lymph node as per history of present illness ENDOCRINE:  No polyuria/polydipsia/polyphagia, no heat/cold intolerance  NEUROLOGIC: No weakness/dizzines/slurred speech PSYCHIATRIC: No concerns with depression/anxiety or sleep problems    Exam:  BP 127/80 mmHg  Pulse 83  Temp(Src) 98.7 F (37.1 C) (Oral)  Ht  (1.651 m)  Wt 128 lb (58.06 kg)  BMI 21.30 kg/m2 Constitutional: VSS, see above. General Appearance: alert, well-developed, well-nourished, NAD Eyes: Normal lids and conjunctive, non-icteric sclera,  Ears, Nose, Mouth, Throat: Normal external inspection ears/nares/mouth/lips/gums, MMM, posterior pharynx without erythema/exudate Neck: No masses, trachea midline. No thyroid enlargement/tenderness/mass appreciated Respiratory: Normal respiratory effort. no wheeze/rhonchi/rales Cardiovascular: S1/S2 normal, no murmur/rub/gallop auscultated. RRR. .  Musculoskeletal: Gait normal. No clubbing/cyanosis of digits.  Neurological: No cranial nerve deficit on limited exam. Motor and sensation intact and symmetric Psychiatric: Normal judgment/insight. Normal mood and affect. Oriented x3.  Lymphatic. No cervical, supraclavicular, axillary, or (lymph nodes are palpable. Large approximately couple-sized lymph nodes 2 palpable in left groin, no hernia, proximal lymph node pulses palpable, would not necessarily characterized as pulsatile mass however. Overlying skin appears normal  No results found for this or any previous visit (from the past 72 hour(s)).    ASSESSMENT/PLAN:  Inguinal lymphadenopathy - Plan: CBC with Differential/Platelet, COMPLETE METABOLIC PANEL WITH GFR, HIV antibody, Epstein-Barr virus VCA antibody panel, RPR, GC/chlamydia probe amp, urine, Urinalysis, Routine w reflex microscopic, [place] Misc Soft Tissue   Advised patient of the tests we will be ordering in the possible causes for enlarged lymph nodes in the groin. Per history, patient does not exhibit any particular risk factors for HIV, has not been sexually active in the past  year however has not been recently tested. We'll also obtain formal ultrasound for further evaluation of the character of the lymph  nodes/masses in case biopsy has to be performed eventually. Patient advised to the plan for workup including we'll obtain labs, we'll call him over the weekend if any of these come back dangerously abnormal, otherwise will follow up with him when all labs are back. Patient is advised to call us if he has not heard back about blood work for about scheduling ultrasound by Tuesday of next week. Importance of close follow-up emphasized with patient. All questions were answered.

## 2014-12-19 LAB — COMPLETE METABOLIC PANEL WITH GFR
ALBUMIN: 4.4 g/dL (ref 3.6–5.1)
ALK PHOS: 54 U/L (ref 40–115)
ALT: 18 U/L (ref 9–46)
AST: 21 U/L (ref 10–40)
BILIRUBIN TOTAL: 0.4 mg/dL (ref 0.2–1.2)
BUN: 14 mg/dL (ref 7–25)
CALCIUM: 9.3 mg/dL (ref 8.6–10.3)
CO2: 27 mmol/L (ref 20–31)
CREATININE: 0.79 mg/dL (ref 0.60–1.35)
Chloride: 101 mmol/L (ref 98–110)
GFR, Est African American: >89 mL/min (ref >=60)
GFR, Est Non African American: >89 mL/min (ref >=60)
Glucose, Bld: 83 mg/dL (ref 65–99)
Potassium: 4.3 mmol/L (ref 3.5–5.3)
Sodium: 139 mmol/L (ref 135–146)
Total Protein: 6.8 g/dL (ref 6.1–8.1)

## 2014-12-19 LAB — CBC WITH DIFFERENTIAL/PLATELET
BASOS ABS: 0 10*3/uL (ref 0.0–0.1)
Basophils Relative: 0 % (ref 0–1)
EOS PCT: 1 % (ref 0–5)
Eosinophils Absolute: 0.1 10*3/uL (ref 0.0–0.7)
HEMATOCRIT: 41.9 % (ref 39.0–52.0)
HEMOGLOBIN: 14 g/dL (ref 13.0–17.0)
LYMPHS ABS: 2 10*3/uL (ref 0.7–4.0)
LYMPHS PCT: 20 % (ref 12–46)
MCH: 29.5 pg (ref 26.0–34.0)
MCHC: 33.4 g/dL (ref 30.0–36.0)
MCV: 88.2 fL (ref 78.0–100.0)
MONO ABS: 1.1 10*3/uL — AB (ref 0.1–1.0)
MPV: 12.6 fL — AB (ref 8.6–12.4)
Monocytes Relative: 11 % (ref 3–12)
Neutro Abs: 6.7 10*3/uL (ref 1.7–7.7)
Neutrophils Relative %: 68 % (ref 43–77)
Platelets: 182 10*3/uL (ref 150–400)
RBC: 4.75 MIL/uL (ref 4.22–5.81)
RDW: 13.5 % (ref 11.5–15.5)
WBC: 9.9 10*3/uL (ref 4.0–10.5)

## 2014-12-19 LAB — URINALYSIS, ROUTINE W REFLEX MICROSCOPIC
Bilirubin Urine: NEGATIVE
Glucose, UA: NEGATIVE
Hgb urine dipstick: NEGATIVE
LEUKOCYTES UA: NEGATIVE
NITRITE: NEGATIVE
Protein, ur: NEGATIVE
SPECIFIC GRAVITY, URINE: 1.029 (ref 1.001–1.035)
pH: 6.5 (ref 5.0–8.0)

## 2014-12-19 LAB — GC/CHLAMYDIA PROBE AMP, URINE
Chlamydia, Swab/Urine, PCR: NEGATIVE
GC PROBE AMP, URINE: NEGATIVE

## 2014-12-19 LAB — HIV ANTIBODY (ROUTINE TESTING W REFLEX): HIV: NONREACTIVE

## 2014-12-19 LAB — RPR

## 2014-12-21 LAB — EPSTEIN-BARR VIRUS VCA ANTIBODY PANEL
EBV EA IgG: <5 U/mL (ref <9.0)
EBV NA IgG: 150 U/mL — ABNORMAL HIGH (ref <18.0)
EBV VCA IGG: 131 U/mL — AB (ref <18.0)
EBV VCA IgM: <10 U/mL (ref <36.0)

## 2014-12-24 ENCOUNTER — Other Ambulatory Visit: Payer: Self-pay | Admitting: Osteopathic Medicine

## 2014-12-24 ENCOUNTER — Other Ambulatory Visit: Payer: No Typology Code available for payment source

## 2014-12-24 ENCOUNTER — Ambulatory Visit (HOSPITAL_BASED_OUTPATIENT_CLINIC_OR_DEPARTMENT_OTHER)
Admission: RE | Admit: 2014-12-24 | Discharge: 2014-12-24 | Disposition: A | Discharge status: Home / Self Care | Payer: No Typology Code available for payment source | Source: Ambulatory Visit | Attending: Osteopathic Medicine | Admitting: Osteopathic Medicine

## 2014-12-24 DIAGNOSIS — R599 Enlarged lymph nodes, unspecified: Secondary | ICD-10-CM | POA: Diagnosis present

## 2014-12-24 DIAGNOSIS — R590 Localized enlarged lymph nodes: Secondary | ICD-10-CM | POA: Insufficient documentation

## 2015-01-05 ENCOUNTER — Encounter: Payer: Self-pay | Admitting: Osteopathic Medicine

## 2015-01-05 ENCOUNTER — Ambulatory Visit (INDEPENDENT_AMBULATORY_CARE_PROVIDER_SITE_OTHER): Payer: No Typology Code available for payment source | Admitting: Osteopathic Medicine

## 2015-01-05 VITALS — BP 121/72 | HR 65 | Wt 125.0 lb

## 2015-01-05 DIAGNOSIS — F17200 Nicotine dependence, unspecified, uncomplicated: Secondary | ICD-10-CM | POA: Diagnosis not present

## 2015-01-05 DIAGNOSIS — Z7141 Alcohol abuse counseling and surveillance of alcoholic: Secondary | ICD-10-CM

## 2015-01-05 DIAGNOSIS — R599 Enlarged lymph nodes, unspecified: Secondary | ICD-10-CM | POA: Diagnosis not present

## 2015-01-05 DIAGNOSIS — R590 Localized enlarged lymph nodes: Secondary | ICD-10-CM

## 2015-01-05 NOTE — Progress Notes (Signed)
HPI: Patrick Mahoney is a 34 y.o. male who presents to [place]  today for chief complaint of:  Chief Complaint  Patient presents with  . Lymph node     Swollen lymph nodes in groin. . Location: R groin . Quality: sore . Severity: mild . Duration: First lump appeared 2 weeks ago, last seen in office 2 weeks ago, workup negative and LN's improved/smaller, see telephone and result note encounters.   Pt also concerned about alcohol use - drinks pretty much daily when he gets home from work, up to 8 beers per night. No eye-opener, not approached by friends/family with any concerns, not annoyed when alcohol use is brought up. No history of withdrawal.   Tobacco use - 2ppd several years. Thinks about quitting but no plan. uit before for about a year.      Past medical, social and family history reviewed: History reviewed. No pertinent past medical history. Past Surgical History  Procedure Laterality Date  . Nasal sinus surgery     Social History  Substance Use Topics  . Smoking status: Current Every Day Smoker -- 2.00 packs/day    Types: Cigarettes  . Smokeless tobacco: Not on file  . Alcohol Use: 18.0 oz/week    30 Cans of beer per week   Family History  Problem Relation Age of Onset  . Diabetes Mother     No current outpatient prescriptions on file.   No current facility-administered medications for this visit.   No Known Allergies    Review of Systems: CONSTITUTIONAL: Positive as per history of present illness fever/chills, no unintentional weight changes HEAD/EYES/EARS/NOSE/THROAT: No headache/vision change or hearing change, no sore throat CARDIAC: No chest pain/pressure/palpitations, no orthopnea RESPIRATORY: No cough/shortness of breath/wheeze GASTROINTESTINAL: No nausea/vomiting/abdominal pain/blood in stool/diarrhea/constipation MUSCULOSKELETAL: No myalgia/arthralgia GENITOURINARY: No incontinence, No abnormal genital  bleeding/discharge SKIN: No rash/wounds/concerning lesions HEM/ONC: No easy bruising/bleeding, positive abnormal lymph node as per history of present illness ENDOCRINE: No polyuria/polydipsia/polyphagia, no heat/cold intolerance  NEUROLOGIC: No weakness/dizzines/slurred speech PSYCHIATRIC: No concerns with depression/anxiety or sleep problems    Exam:  Filed Vitals:   01/05/15 1116  BP: 121/72  Pulse: 65   Constitutional: VSS, see above. General Appearance: alert, well-developed, well-nourished, NAD  Neurological: No cranial nerve deficit on limited exam. Motor and sensation intact and symmetric Psychiatric: Normal judgment/insight. Normal mood and affect. Oriented x3.  Lymphatic. No cervical, supraclavicular, axillary, or (lymph nodes are palpable. Large approximately lymph nodes 2 palpable in left groin, smaller than previous exam, nontender, no hernia, Overlying skin appears normal  No results found for this or any previous visit (from the past 72 hour(s)).    ASSESSMENT/PLAN:  Inguinal lymphadenopathy - resolving, w/u negative for infectious or malignant concerns  Tobacco dependence - Patient has been educated on importance of tobacco cessation to decrease risks of cardiovascular and pulmonary disease and to improve overall health & wellbeing.   Alcohol abuse counseling and surveillance - approx 64 ox beer daily, not in range for concern for DT withdrawal but patient is counseled on cutting back, educated on withdrawal symptoms and ER precautions.   RTC 6 mos - pt not interested in pharmacologic management of above problems, judgment and insight is good and I think we can plan to cut back and eventually quit/moderate use of above substances.   Pt declines influenza/Td today.     MALE PREVENTIVE CARE  ANNUAL SCREENING/COUNSELING Tobacco - Current  2 ppd x 16 years  Alcohol - social  drinker, pt concerned about alchol use  Concerned about increasing number of beers, almost  every night, if he doesn't drink he has trouble sleeping  Diet/Exercise - HEALTHY HABITS DISCUSSED TO DECREASE CV RISK, aikido 3 times per week, insanity workouts  Sexual Health - No STI - The patient denies history of sexually transmitted disease. INTERESTED IN STI TESTING - no Depression - PQH2 Negative Domestic violence concerns - no HTN SCREENING - SEE VITALS Vaccination status - SEE BELOW  INFECTIOUS DISEASE SCREENING HIV - all adults 15-65 - does not need GC/CT - sexually active - does not need HepC - born 58-1967 - does not need TB - if risk/required by employer - does not need  DISEASE SCREENING Lipid - (Low risk screen M35; High risk screen M25 if HTN, Tob, FH CHD M<55/F<65) - needs, will get this 6 mos DM2 (45+ or Risk = FH 1st deg DM, Hx GDM, overweight/sedentary, high-risk ethnicity, HTN) - does not need Osteoporosis - age 20+ or one sooner if risk - does not need AAA - once age 39-75 if smoker - does not need  CANCER SCREENING Lung - annual low dose CT Chest age 32-85 w/ 30+ PY, current/quit past 15 years - CT - does not need Colon - age 27+ or 36 years of age prior to [place] Dx - GI REFERRAL - does not need Prostate - (shared decision making age 42 in average-risk, age 33 to 37 high risk black men, men with a family history of prostate cancer younger than age 84, BRCA1 or BRCA2) - does not need   ADULT VACCINATION Influenza - annual - was offered and declined by the patient Td booster every 10 years - Pt states this done severeal years ago <10 HPV - age <75yo - was not indicated Zoster - age 74+ - was not indicated Pneumonia - age 3+ sooner if risk (DM, smoker, other) - was not indicated   OTHER Fall - exercise and Vit D age 94+ - does not need Consider ASA - age 23-59 - does not need    Total time spent 30 minutes, greater than 50% of the visit was counseling and coordinating care for diagnosis of tobacco abuse, alcohol use.

## 2016-06-01 IMAGING — US US EXTREM LOW*R* LIMITED
1 series · 14 of 22 positions shown · non-contrast
Comparison: None.

CLINICAL DATA: Right inguinal nodes. Nodes are clinically receding.

EXAM:
ULTRASOUND right LOWER EXTREMITY LIMITED
TECHNIQUE: Ultrasound examination of the lower extremity soft tissues was
performed in the area of clinical concern.

[Series 1: us extrem low*right* limited · 0.05mm/px · 14 of 22 slices shown]
[im 1/22]
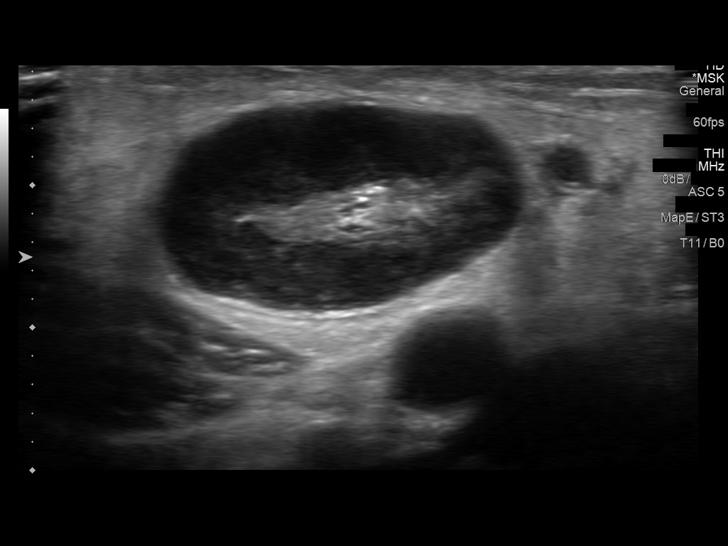
[im 3/22]
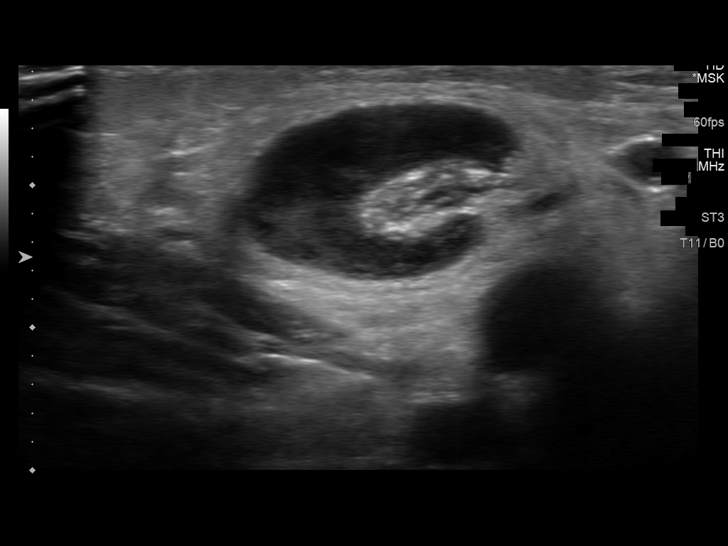
[im 4/22]
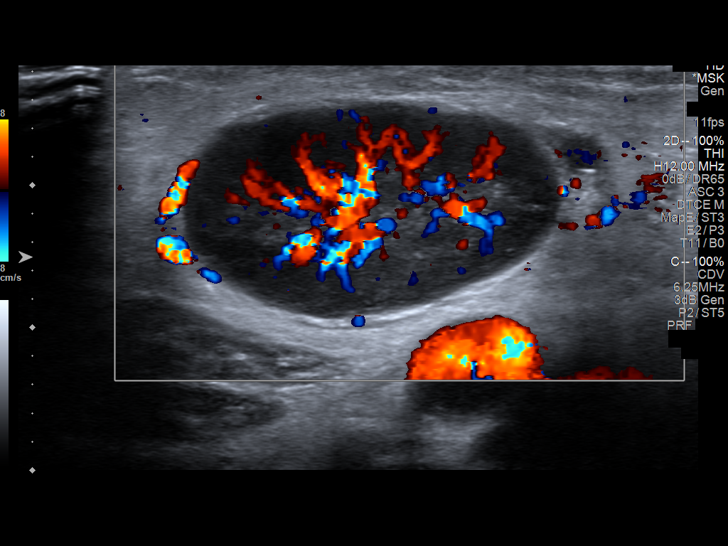
[im 6/22]
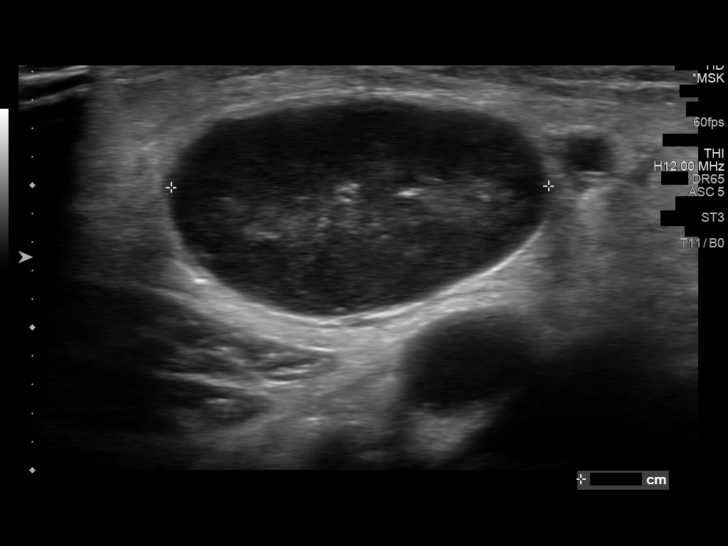
[im 8/22]
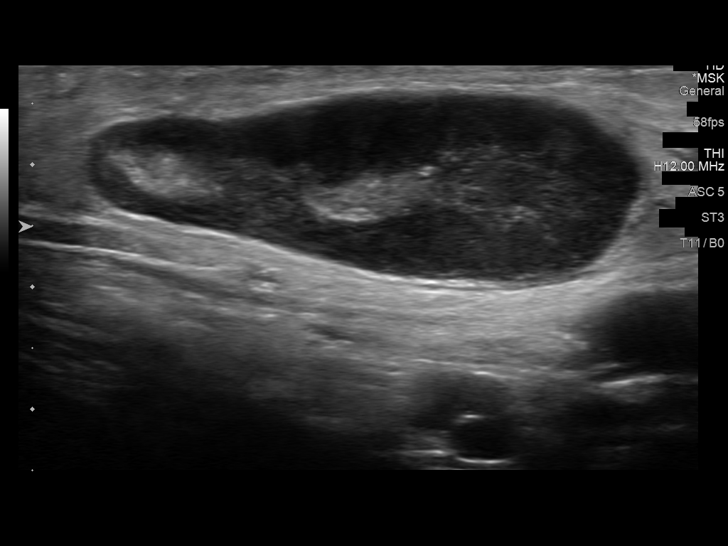
[im 9/22]
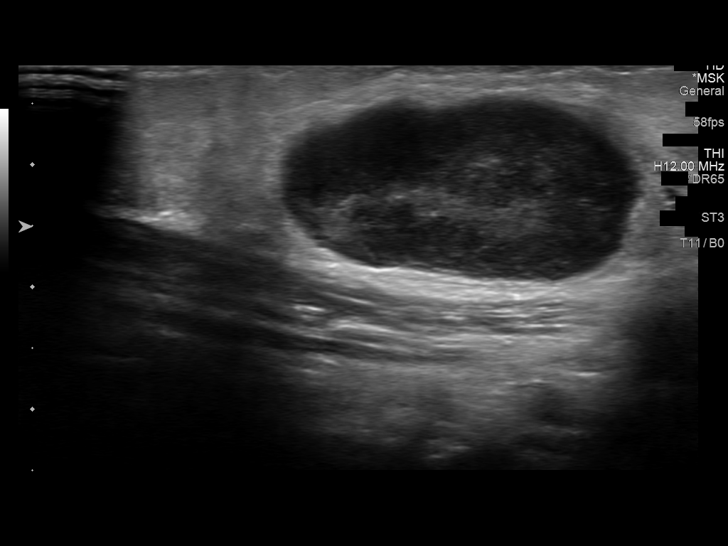
[im 11/22]
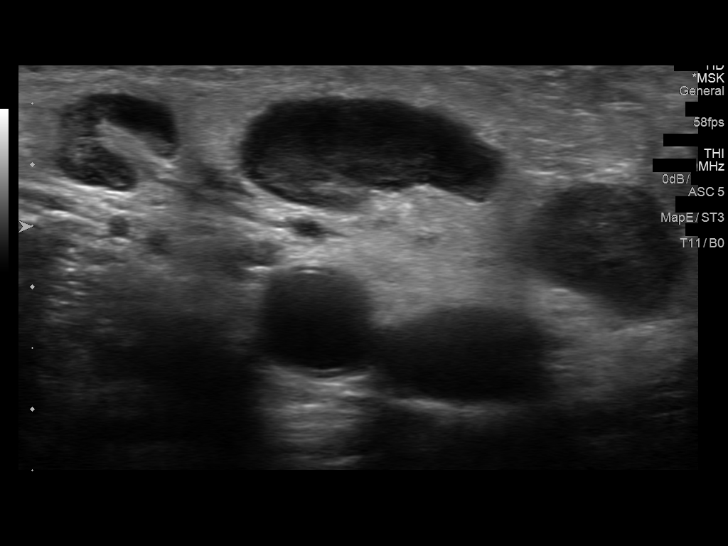
[im 12/22]
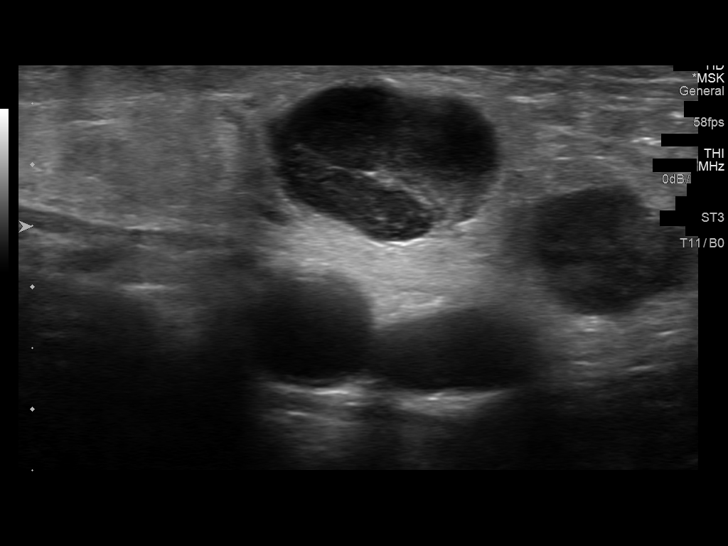
[im 14/22]
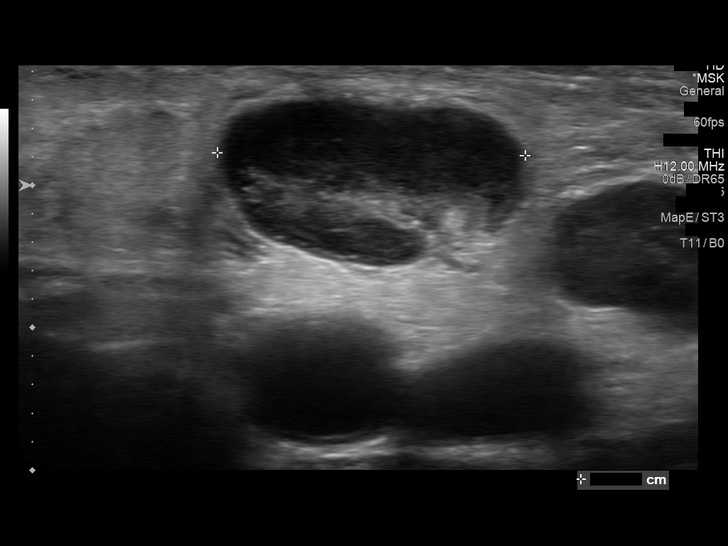
[im 15/22]
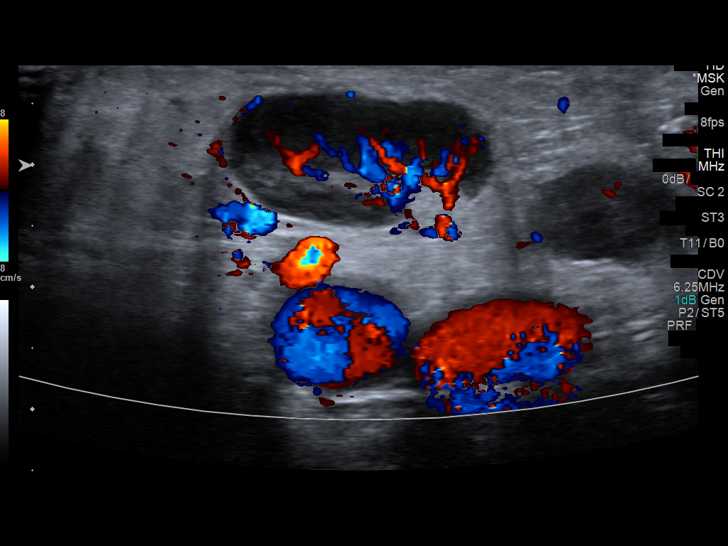
[im 17/22]
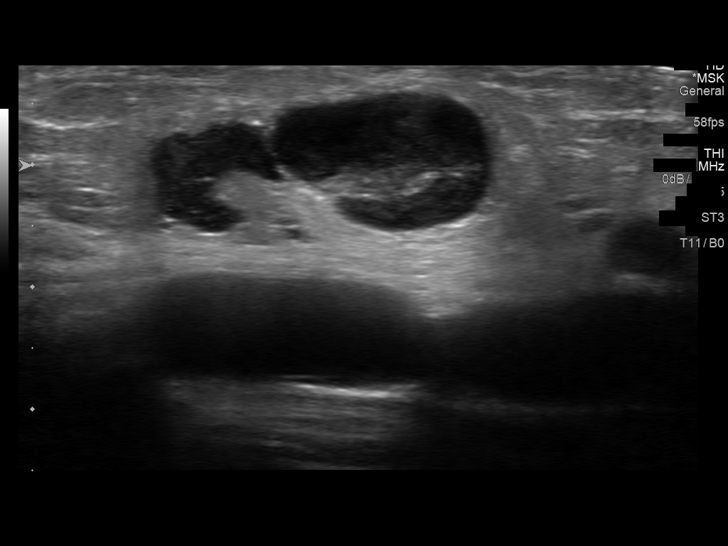
[im 19/22]
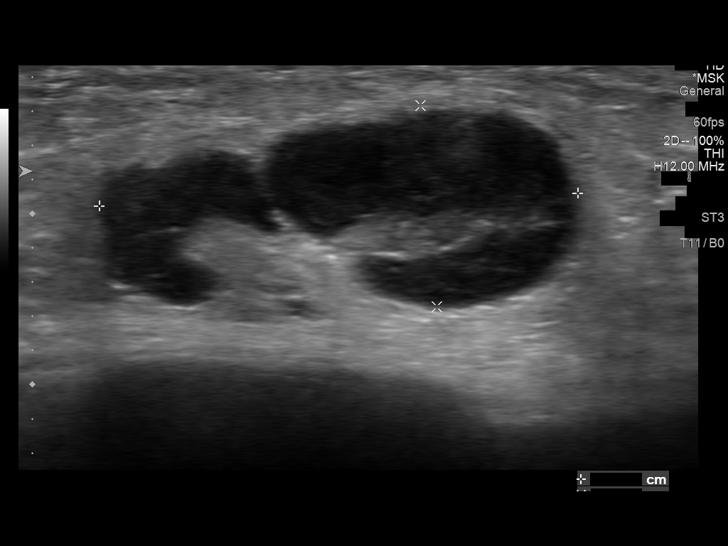
[im 20/22]
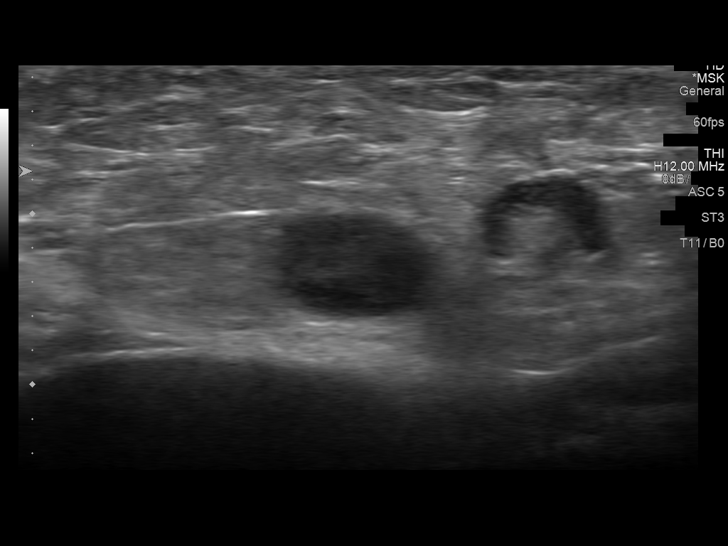
[im 22/22]
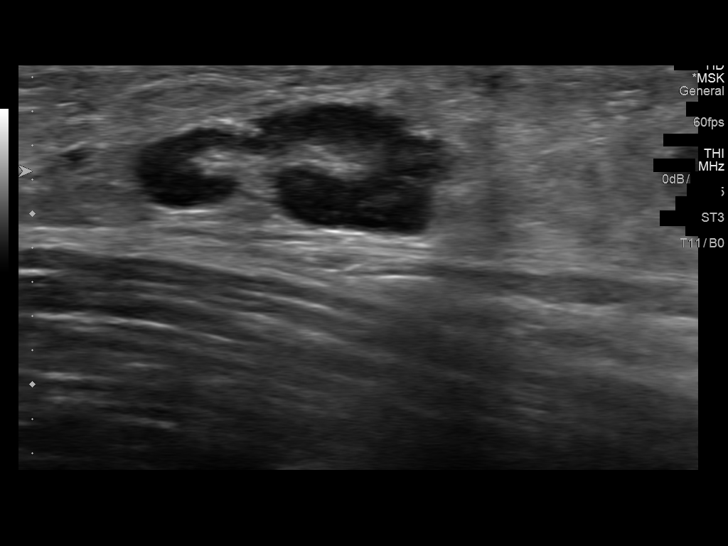

[14 of 22 positions shown; findings below may reference images not displayed]

FINDINGS: Fat containing right inguinal lymph nodes are present with some
degree of hyperemia. Lymph nodes measure up to 4.3 x 2.7 x 1.4 cm.
These could be benign or malignant. Continued follow-up exam can be
obtained to demonstrate resolution.
IMPRESSION: Right inguinal prominent lymphadenopathy.

## 2022-12-14 ENCOUNTER — Ambulatory Visit (HOSPITAL_COMMUNITY)
Admission: EM | Admit: 2022-12-14 | Discharge: 2022-12-14 | Disposition: A | Discharge status: Home / Self Care | Payer: BC Managed Care – PPO | Attending: Internal Medicine | Admitting: Internal Medicine

## 2022-12-14 ENCOUNTER — Encounter (HOSPITAL_COMMUNITY): Payer: Self-pay | Admitting: Emergency Medicine

## 2022-12-14 DIAGNOSIS — L723 Sebaceous cyst: Secondary | ICD-10-CM | POA: Diagnosis not present

## 2022-12-14 DIAGNOSIS — L089 Local infection of the skin and subcutaneous tissue, unspecified: Secondary | ICD-10-CM | POA: Diagnosis not present

## 2022-12-14 MED ORDER — AMOXICILLIN-POT CLAVULANATE 875-125 MG PO TABS: 1.0000 | ORAL_TABLET | Freq: Two times a day (BID) | ORAL | 0 refills | Status: AC

## 2022-12-14 MED ORDER — IBUPROFEN 600 MG PO TABS: 600.0000 mg | ORAL_TABLET | Freq: Four times a day (QID) | ORAL | 0 refills | Status: AC | PRN

## 2022-12-14 NOTE — ED Triage Notes (Signed)
Pt reports had abscess on scrotum that been ongoing for 2 years. Saw PCP earlier today and was set up for ultrasound for next Friday for it. When pt went to work after the appt, the area burst and drained.pt feels there is still stuff in area.

## 2022-12-14 NOTE — Discharge Instructions (Addendum)
Please take antibiotics as prescribed Continue sitz bath's Please wash the area with soap and water If you have worsening pain, swelling or discharge please return to urgent care to be reevaluated Please keep your appointment for ultrasound.

## 2022-12-18 NOTE — ED Provider Notes (Signed)
[facility]    CSN: [id number] Arrival date & time: 12/14/22  1734      History   Chief Complaint Chief Complaint  Patient presents with   Abscess    HPI Patrick Mahoney is a 42 y.o. male comes to the urgent care with painful swelling of the right groin area.  Right groin became painful about 2 weeks ago.  He was seen by the primary care provider and was scheduled to have an ultrasound next Friday.  Patient has had a cyst in the right groin area over the past couple of years.  States is usually not painful.  Over the past several days patient has been experiencing worsening swelling and pain.  This morning patient says the pain worsens.  Subsequently patient noticed drainage which was purulent and foul-smelling.  He decided to come to urgent care to be evaluated.  After the drainage subsided the pain improved significantly as well.  No fever or chills.  No scrotal redness.  Patient shaves his groin area regularly.   HPI  History reviewed. No pertinent past medical history.  Patient Active Problem List   Diagnosis Date Noted   Inguinal lymphadenopathy 12/18/2014   Cat bite of right hand including fingers with infection 03/22/2013    Past Surgical History:  Procedure Laterality Date   NASAL SINUS SURGERY         Home Medications    Prior to Admission medications   Medication Sig Start Date End Date Taking? Authorizing Provider  amoxicillin-clavulanate (AUGMENTIN) 875-125 MG tablet Take 1 tablet by mouth every 12 (twelve) hours. 12/14/22  Yes Timmie Dugue, Britta Mccreedy, MD  ibuprofen (ADVIL) 600 MG tablet Take 1 tablet (600 mg total) by mouth every 6 (six) hours as needed. 12/14/22  Yes Iliany Losier, Britta Mccreedy, MD    Family History Family History  Problem Relation Age of Onset   Diabetes Mother     Social History Social History   Tobacco Use   Smoking status: Every Day    Current packs/day: 2.00    Types: Cigarettes  Substance Use Topics   Alcohol use: Yes     Alcohol/week: 30.0 standard drinks of alcohol    Types: 30 Cans of beer per week   Drug use: No     Allergies   Patient has no known allergies.   Review of Systems Review of Systems As per HPI  Physical Exam Triage Vital Signs ED Triage Vitals  Encounter Vitals Group     BP 12/14/22 1813 (!) 175/107     Systolic BP Percentile --      Diastolic BP Percentile --      Pulse Rate 12/14/22 1813 91     Resp 12/14/22 1813 15     Temp 12/14/22 1813 98.3 F (36.8 C)     Temp Source 12/14/22 1813 Oral     SpO2 12/14/22 1813 98 %     Weight --      Height --      Head Circumference --      Peak Flow --      Pain Score 12/14/22 1811 0     Pain Loc --      Pain Education --      Exclude from Growth Chart --    No data found.  Updated Vital Signs BP (!) 175/107 (BP Location: Left Arm)   Pulse 91   Temp 98.3 F (36.8 C) (Oral)   Resp 15   SpO2 98%  Visual Acuity Right Eye Distance:   Left Eye Distance:   Bilateral Distance:    Right Eye Near:   Left Eye Near:    Bilateral Near:     Physical Exam Vitals and nursing note reviewed.  Constitutional:      General: He is in acute distress.     Appearance: He is not ill-appearing.  Cardiovascular:     Rate and Rhythm: Normal rate and regular rhythm.     Pulses: Normal pulses.     Heart sounds: Normal heart sounds.  Pulmonary:     Effort: Pulmonary effort is normal.     Breath sounds: Normal breath sounds.  Skin:    Comments: Draining abscess in the right groin area.  Induration measures about 2 inches in the longest diameter.  Some purulent material was squeezed out of the area..  Testicular exam is unremarkable.  Neurological:     Mental Status: He is alert.      UC Treatments / Results  Labs (all labs ordered are listed, but only abnormal results are displayed) Labs Reviewed - No data to display  EKG   Radiology No results found.  Procedures Procedures (including critical care time)  Medications  Ordered in UC Medications - No data to display  Initial Impression / Assessment and Plan / UC Course  I have reviewed the triage vital signs and the nursing notes.  Pertinent labs & imaging results that were available during my care of the patient were reviewed by me and considered in my medical decision making (see chart for details).     1.  Infected sebaceous cyst in the groin area: Patient is advised to take ibuprofen as needed for pain Sitz bath's recommended Patient is advised to complete the course of antibiotics-Augmentin 875-125 mg twice daily for 7 days Ibuprofen as needed for pain Return precautions given. Final Clinical Impressions(s) / UC Diagnoses   Final diagnoses:  Infected sebaceous cyst of skin     Discharge Instructions      Please take antibiotics as prescribed Continue sitz bath's Please wash the area with soap and water If you have worsening pain, swelling or discharge please return to urgent care to be reevaluated Please keep your appointment for ultrasound.   ED Prescriptions     Medication Sig Dispense Auth. Provider   ibuprofen (ADVIL) 600 MG tablet Take 1 tablet (600 mg total) by mouth every 6 (six) hours as needed. 30 tablet Kataleia Quaranta, Britta Mccreedy, MD   amoxicillin-clavulanate (AUGMENTIN) 875-125 MG tablet Take 1 tablet by mouth every 12 (twelve) hours. 14 tablet Kaspar Albornoz, Britta Mccreedy, MD      PDMP not reviewed this encounter.   Merrilee Jansky, MD 12/18/22 1800
